# Patient Record
Sex: Female | Born: 1990 | Race: Black or African American | Hispanic: No | Marital: Single | Smoking: Never smoker
Health system: Southern US, Community
[De-identification: ages and names within clinical notes are randomized; demographics above are authoritative.]

## PROBLEM LIST (undated history)

## (undated) ENCOUNTER — Emergency Department (HOSPITAL_BASED_OUTPATIENT_CLINIC_OR_DEPARTMENT_OTHER): Payer: Self-pay | Source: Home / Self Care

## (undated) DIAGNOSIS — J45909 Unspecified asthma, uncomplicated: Secondary | ICD-10-CM

---

## 2016-06-18 ENCOUNTER — Emergency Department (HOSPITAL_BASED_OUTPATIENT_CLINIC_OR_DEPARTMENT_OTHER): Payer: Self-pay

## 2016-06-18 ENCOUNTER — Emergency Department (HOSPITAL_BASED_OUTPATIENT_CLINIC_OR_DEPARTMENT_OTHER)
Admission: EM | Admit: 2016-06-18 | Discharge: 2016-06-18 | Disposition: A | Discharge status: Home / Self Care | Payer: Self-pay | Attending: Emergency Medicine | Admitting: Emergency Medicine

## 2016-06-18 ENCOUNTER — Encounter (HOSPITAL_BASED_OUTPATIENT_CLINIC_OR_DEPARTMENT_OTHER): Payer: Self-pay | Admitting: *Deleted

## 2016-06-18 DIAGNOSIS — R1084 Generalized abdominal pain: Secondary | ICD-10-CM

## 2016-06-18 DIAGNOSIS — Z79899 Other long term (current) drug therapy: Secondary | ICD-10-CM | POA: Insufficient documentation

## 2016-06-18 DIAGNOSIS — K2970 Gastritis, unspecified, without bleeding: Secondary | ICD-10-CM

## 2016-06-18 DIAGNOSIS — R110 Nausea: Secondary | ICD-10-CM

## 2016-06-18 DIAGNOSIS — J45909 Unspecified asthma, uncomplicated: Secondary | ICD-10-CM | POA: Insufficient documentation

## 2016-06-18 HISTORY — DX: Unspecified asthma, uncomplicated: J45.909

## 2016-06-18 LAB — CBC WITH DIFFERENTIAL/PLATELET
BASOS ABS: 0 10*3/uL (ref 0.0–0.1)
Basophils Relative: 0 %
EOS PCT: 1 %
Eosinophils Absolute: 0.1 10*3/uL (ref 0.0–0.7)
HCT: 36.3 % (ref 36.0–46.0)
Hemoglobin: 12.3 g/dL (ref 12.0–15.0)
LYMPHS ABS: 2.9 10*3/uL (ref 0.7–4.0)
Lymphocytes Relative: 26 %
MCH: 31.3 pg (ref 26.0–34.0)
MCHC: 33.9 g/dL (ref 30.0–36.0)
MCV: 92.4 fL (ref 78.0–100.0)
Monocytes Absolute: 0.9 10*3/uL (ref 0.1–1.0)
Monocytes Relative: 8 %
Neutro Abs: 7.3 10*3/uL (ref 1.7–7.7)
Neutrophils Relative %: 65 %
PLATELETS: 228 10*3/uL (ref 150–400)
RBC: 3.93 MIL/uL (ref 3.87–5.11)
RDW: 13.8 % (ref 11.5–15.5)
WBC: 11.2 10*3/uL — AB (ref 4.0–10.5)

## 2016-06-18 LAB — COMPREHENSIVE METABOLIC PANEL
ALT: 13 U/L — AB (ref 14–54)
AST: 16 U/L (ref 15–41)
Albumin: 3.7 g/dL (ref 3.5–5.0)
Alkaline Phosphatase: 54 U/L (ref 38–126)
Anion gap: 5 (ref 5–15)
BUN: 7 mg/dL (ref 6–20)
CHLORIDE: 107 mmol/L (ref 101–111)
CO2: 26 mmol/L (ref 22–32)
CREATININE: 0.77 mg/dL (ref 0.44–1.00)
Calcium: 9 mg/dL (ref 8.9–10.3)
GFR calc non Af Amer: >60 mL/min (ref >60)
Glucose, Bld: 95 mg/dL (ref 65–99)
Potassium: 4.1 mmol/L (ref 3.5–5.1)
SODIUM: 138 mmol/L (ref 135–145)
Total Bilirubin: 0.3 mg/dL (ref 0.3–1.2)
Total Protein: 7.4 g/dL (ref 6.5–8.1)

## 2016-06-18 LAB — URINALYSIS, ROUTINE W REFLEX MICROSCOPIC
Bilirubin Urine: NEGATIVE
Glucose, UA: NEGATIVE mg/dL
Hgb urine dipstick: NEGATIVE
KETONES UR: NEGATIVE mg/dL
Leukocytes, UA: NEGATIVE
NITRITE: NEGATIVE
PROTEIN: NEGATIVE mg/dL
Specific Gravity, Urine: 1.02 (ref 1.005–1.030)
pH: 6.5 (ref 5.0–8.0)

## 2016-06-18 LAB — LIPASE, BLOOD: Lipase: 19 U/L (ref 11–51)

## 2016-06-18 LAB — PREGNANCY, URINE: PREG TEST UR: NEGATIVE

## 2016-06-18 MED ORDER — ONDANSETRON HCL 4 MG/2ML IJ SOLN
4.0000 mg | Freq: Once | INTRAMUSCULAR | Status: AC
  Administered 2016-06-18: 4 mg via INTRAVENOUS
  Filled 2016-06-18: qty 2

## 2016-06-18 MED ORDER — ONDANSETRON 4 MG PO TBDP: 4.0000 mg | ORAL_TABLET | Freq: Three times a day (TID) | ORAL | 0 refills | Status: AC | PRN

## 2016-06-18 MED ORDER — SODIUM CHLORIDE 0.9 % IV BOLUS (SEPSIS)
1000.0000 mL | Freq: Once | INTRAVENOUS | Status: AC
  Administered 2016-06-18: 1000 mL via INTRAVENOUS

## 2016-06-18 MED ORDER — MORPHINE SULFATE (PF) 4 MG/ML IV SOLN
4.0000 mg | Freq: Once | INTRAVENOUS | Status: AC
  Administered 2016-06-18: 4 mg via INTRAVENOUS
  Filled 2016-06-18: qty 1

## 2016-06-18 MED ORDER — FAMOTIDINE IN NACL 20-0.9 MG/50ML-% IV SOLN
20.0000 mg | Freq: Once | INTRAVENOUS | Status: AC
  Administered 2016-06-18: 20 mg via INTRAVENOUS
  Filled 2016-06-18: qty 50

## 2016-06-18 MED ORDER — RANITIDINE HCL 150 MG PO TABS: 150.0000 mg | ORAL_TABLET | Freq: Two times a day (BID) | ORAL | 0 refills | Status: AC

## 2016-06-18 NOTE — Discharge Instructions (Signed)
Your abdominal pain is likely from gastritis or an ulcer. You will need to take zantac as directed, and avoid spicy/fatty/acidic foods, avoid soda/coffee/tea/alcohol. Avoid laying down flat within 30 minutes of eating. Avoid NSAIDs like ibuprofen/aleve/motrin/etc on an empty stomach. May consider using over the counter tums/maalox as needed for additional relief. Use zofran as directed as needed for nausea. Use tylenol as needed for pain. Follow up with your regular doctor in one week for ongoing evaluation of your abdominal pain. Return to the ER for changes or worsening symptoms.  Abdominal (belly) pain can be caused by many things. Your caregiver performed an examination and possibly ordered blood/urine tests and imaging (CT scan, x-rays, ultrasound). Many cases can be observed and treated at home after initial evaluation in the emergency department. Even though you are being discharged home, abdominal pain can be unpredictable. Therefore, you need a repeated exam if your pain does not resolve, returns, or worsens. Most patients with abdominal pain don't have to be admitted to the hospital or have surgery, but serious problems like appendicitis and gallbladder attacks can start out as nonspecific pain. Many abdominal conditions cannot be diagnosed in one visit, so follow-up evaluations are very important. SEEK IMMEDIATE MEDICAL ATTENTION IF YOU DEVELOP ANY OF THE FOLLOWING SYMPTOMS: The pain does not go away or becomes severe.  A temperature above 101 develops.  Repeated vomiting occurs (multiple episodes).  The pain becomes localized to portions of the abdomen. The right side could possibly be appendicitis. In an adult, the left lower portion of the abdomen could be colitis or diverticulitis.  Blood is being passed in stools or vomit (bright red or black tarry stools).  Return also if you develop chest pain, difficulty breathing, dizziness or fainting, or become confused, poorly responsive, or  inconsolable (young children). The constipation stays for more than 4 days.  There is belly (abdominal) or rectal pain.  You do not seem to be getting better.

## 2016-06-18 NOTE — ED Notes (Signed)
Patient transported to Ultrasound 

## 2016-06-18 NOTE — ED Provider Notes (Signed)
[facility]   CSN: [id number] Arrival date & time: 06/18/16  [id number]     History   Chief Complaint Chief Complaint  Patient presents with  . Abdominal Pain    HPI Robin Gentry is a 26 y.o. female with a PMHx of asthma, and no significant PSHx, who presents to the ED with complaints of generalized abdominal pain 3 days. Patient describes the pain as 8/10 constant cramping and burning nonradiating generalized abdominal pain mostly localized around the periumbilical and upper abdominal area, worse with movement, laughing, and eating, unrelieved with Mira lax, Gas-X, and Pepto-Bismol, and mildly improved with Tylenol. Associated symptoms include nausea and decreased appetite. Robin Gentry admits to taking NSAIDs on a fairly regular basis for her menstrual cramps. Robin Gentry has an irregular menstrual cycle, LMP 05/24/16. Robin Gentry is sexually active with one female partner, unprotected. Robin Gentry denies any recent foreign travel, sick contacts, suspicious food intake, frequent alcohol use, or prior abdominal surgeries. However Robin Gentry does admit that Robin Gentry had 1 mixed EtOH beverage prior to onset of her symptoms, but states Robin Gentry drinks very infrequently and that was her only recent EtOH intake. Drinks coffee every morning.  Robin Gentry denies fevers, chills, CP, SOB, V/D/C, obstipation, melena, hematochezia, flank pain, hematuria, dysuria, vaginal bleeding/discharge, myalgias, arthralgias, numbness, tingling, focal weakness, or any other complaints at this time.    The history is provided by the patient and medical records. No language interpreter was used.  Abdominal Pain   This is a new problem. The current episode started more than 2 days ago. The problem occurs constantly. The problem has not changed since onset.The pain is associated with an unknown factor. The pain is located in the generalized abdominal region. The quality of the pain is cramping and burning. The pain is at a severity of 8/10. The pain is  moderate. Associated symptoms include nausea. Pertinent negatives include fever, diarrhea, flatus, hematochezia, melena, vomiting, constipation, dysuria, hematuria, arthralgias and myalgias. The symptoms are aggravated by eating and activity. The symptoms are relieved by acetaminophen.    Past Medical History:  Diagnosis Date  . Asthma     There are no active problems to display for this patient.   History reviewed. No pertinent surgical history.  OB History    No data available       Home Medications    Prior to Admission medications   Medication Sig Start Date End Date Taking? Authorizing Provider  albuterol (PROVENTIL HFA;VENTOLIN HFA) 108 (90 Base) MCG/ACT inhaler Inhale 1-2 puffs into the lungs every 6 (six) hours as needed for wheezing or shortness of breath.   Yes Historical Provider, MD    Family History No family history on file.  Social History Social History  Substance Use Topics  . Smoking status: Never Smoker  . Smokeless tobacco: Never Used  . Alcohol use Yes     Comment: 1-2x/wk     Allergies   Patient has no known allergies.   Review of Systems Review of Systems  Constitutional: Positive for appetite change. Negative for chills and fever.  Respiratory: Negative for shortness of breath.   Cardiovascular: Negative for chest pain.  Gastrointestinal: Positive for abdominal pain and nausea. Negative for blood in stool, constipation, diarrhea, flatus, hematochezia, melena and vomiting.  Genitourinary: Negative for dysuria, flank pain, hematuria, vaginal bleeding and vaginal discharge.  Musculoskeletal: Negative for arthralgias and myalgias.  Skin: Negative for color change.  Allergic/Immunologic: Negative for immunocompromised state.  Neurological: Negative for weakness and numbness.  Psychiatric/Behavioral:  Negative for confusion.   10 Systems reviewed and are negative for acute change except as noted in the HPI.   Physical Exam Updated Vital  Signs BP 119/76 (BP Location: Right Arm)   Pulse (!) 59   Temp 98.4 F (36.9 C) (Oral)   Resp 16   Ht 5\' 7"  (1.702 m)   Wt 90.7 kg   LMP 05/24/2016   SpO2 100%   BMI 31.32 kg/m   Physical Exam  Constitutional: Robin Gentry is oriented to person, place, and time. Vital signs are normal. Robin Gentry appears well-developed and well-nourished.  Non-toxic appearance. No distress.  Afebrile, nontoxic, NAD  HENT:  Head: Normocephalic and atraumatic.  Mouth/Throat: Oropharynx is clear and moist and mucous membranes are normal.  Eyes: Conjunctivae and EOM are normal. Right eye exhibits no discharge. Left eye exhibits no discharge.  Neck: Normal range of motion. Neck supple.  Cardiovascular: Normal rate, regular rhythm, normal heart sounds and intact distal pulses.  Exam reveals no gallop and no friction rub.   No murmur heard. Pulmonary/Chest: Effort normal and breath sounds normal. No respiratory distress. Robin Gentry has no decreased breath sounds. Robin Gentry has no wheezes. Robin Gentry has no rhonchi. Robin Gentry has no rales.  Abdominal: Soft. Normal appearance and bowel sounds are normal. Robin Gentry exhibits no distension. There is tenderness in the right upper quadrant, epigastric area and left upper quadrant. There is positive Murphy's sign. There is no rigidity, no rebound, no guarding, no CVA tenderness and no tenderness at McBurney's point.  Soft, nondistended, +BS throughout, with mild generalized TTP throughout but mostly focalized in the upper quadrants bilaterally and particularly in the RUQ, no r/g/r, +murphy's, neg mcburney's, no CVA TTP   Musculoskeletal: Normal range of motion.  Neurological: Robin Gentry is alert and oriented to person, place, and time. Robin Gentry has normal strength. No sensory deficit.  Skin: Skin is warm, dry and intact. No rash noted.  Psychiatric: Robin Gentry has a normal mood and affect.  Nursing note and vitals reviewed.    ED Treatments / Results  Labs (all labs ordered are listed, but only abnormal results are  displayed) Labs Reviewed  CBC WITH DIFFERENTIAL/PLATELET - Abnormal; Notable for the following:       Result Value   WBC 11.2 (*)    All other components within normal limits  COMPREHENSIVE METABOLIC PANEL - Abnormal; Notable for the following:    ALT 13 (*)    All other components within normal limits  URINALYSIS, ROUTINE W REFLEX MICROSCOPIC  PREGNANCY, URINE  LIPASE, BLOOD    EKG  EKG Interpretation None       Radiology US Abdomen Complete  Result Date: 06/18/2016 CLINICAL DATA:  Diffuse abdominal pain for 4 days, waxing and waning. Nausea. Bloating. EXAM: ABDOMEN ULTRASOUND COMPLETE COMPARISON:  None. FINDINGS: Gallbladder: No gallstones or wall thickening visualized. No sonographic Murphy sign noted by sonographer. Common bile duct: Diameter: 4 mm Liver: No focal lesion identified. Within normal limits in parenchymal echogenicity. IVC: No abnormality visualized. Pancreas:  Limited visualized portion unremarkable. Spleen: Size and appearance within normal limits. Right Kidney: Length: 11.4 cm. Echogenicity within normal limits. No mass or hydronephrosis visualized. Left Kidney: Length: 10.7 cm. Echogenicity within normal limits. No mass or hydronephrosis visualized. Abdominal aorta: No aneurysm visualized. Other findings: None. IMPRESSION: Normal abdominal sonogram, with no cholelithiasis. Electronically Signed   By: Delbert Phenix M.D.   On: 06/18/2016 10:46    Procedures Procedures (including critical care time)  Medications Ordered in ED Medications  sodium chloride 0.9 %  bolus 1,000 mL (1,000 mLs Intravenous New Bag/Given 06/18/16 0948)  ondansetron (ZOFRAN) injection 4 mg (4 mg Intravenous Given 06/18/16 0948)  famotidine (PEPCID) IVPB 20 mg premix (0 mg Intravenous Stopped 06/18/16 1035)  morphine 4 MG/ML injection 4 mg (4 mg Intravenous Given 06/18/16 0952)     Initial Impression / Assessment and Plan / ED Course  I have reviewed the triage vital signs and the nursing  notes.  Pertinent labs & imaging results that were available during my care of the patient were reviewed by me and considered in my medical decision making (see chart for details).     26 y.o. female here with generalized abd pain and nausea/loss of appetite x3 days. On exam, mild generalized TTP throughout but mostly concentrated in the upper quadrants bilaterally, especially in the RUQ, +murphy's, no CVA TTP, otherwise nonperitoneal. No significant lower abd TTP. No vaginal complaints, doubt need for pelvic exam at this time. Will obtain labs and abd u/s, give pain meds, pepcid, zofran, and fluids, then reassess shortly.   12:07 PM Upreg neg. U/A unremarkable. CBC w/diff with mildly elevated WBC 11.2 however differential unremarkable. CMP WNL. Lipase WNL. U/S unremarkable, no evidence of cholelithiasis. Pt feeling better after meds. Symptoms likely related to gastritis/PUD, will start on zantac and give zofran, diet/lifestyle modifications advised, tylenol PRN pain, advised limiting motrin use, and tums/maalox PRN for additional relief. F/up with PCP in 1wk for recheck. I explained the diagnosis and have given explicit precautions to return to the ER including for any other new or worsening symptoms. The patient understands and accepts the medical plan as it's been dictated and I have answered their questions. Discharge instructions concerning home care and prescriptions have been given. The patient is STABLE and is discharged to home in good condition.   Final Clinical Impressions(s) / ED Diagnoses   Final diagnoses:  Generalized abdominal pain  Nausea  Gastritis, presence of bleeding unspecified, unspecified chronicity, unspecified gastritis type    New Prescriptions New Prescriptions   ONDANSETRON (ZOFRAN ODT) 4 MG DISINTEGRATING TABLET    Take 1 tablet (4 mg total) by mouth every 8 (eight) hours as needed for nausea or vomiting.   RANITIDINE (ZANTAC) 150 MG TABLET    Take 1 tablet (150 mg  total) by mouth 2 (two) times daily.     [address], PA-C 06/18/16 1610    Pricilla Loveless, MD 06/21/16 [id number]

## 2016-06-18 NOTE — ED Triage Notes (Signed)
Pt reports generalized abd pain (concentrated around periumbilicus) since this past Thursday. Denies fever, v/d. Reports nausea. Denies genitourinary symptoms. Reports taking Tylenol with minimal relief.

## 2017-10-29 IMAGING — US US ABDOMEN COMPLETE
1 series · 14 of 25 positions shown · non-contrast
Comparison: None.

CLINICAL DATA: Diffuse abdominal pain for 4 days, waxing and
waning. Nausea. Bloating.

EXAM:
ABDOMEN ULTRASOUND COMPLETE

[Series 1: us abdomen complete · 0.20mm/px · 14 of 85 slices shown]
[im 1/85]
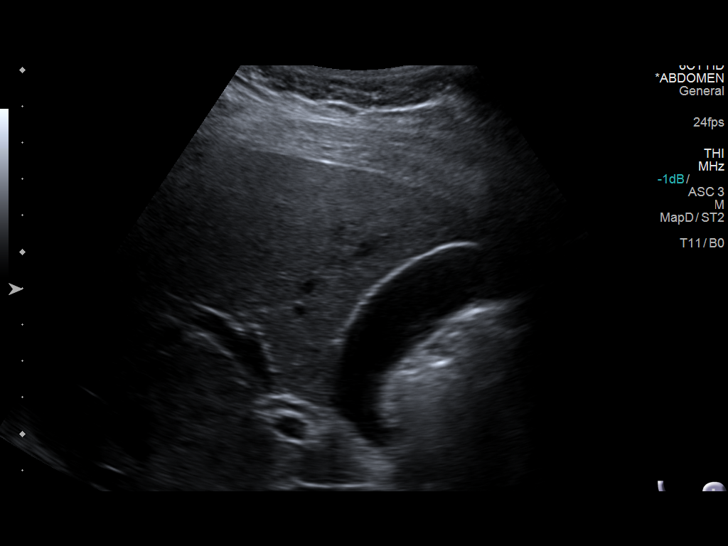
[im 8/85]
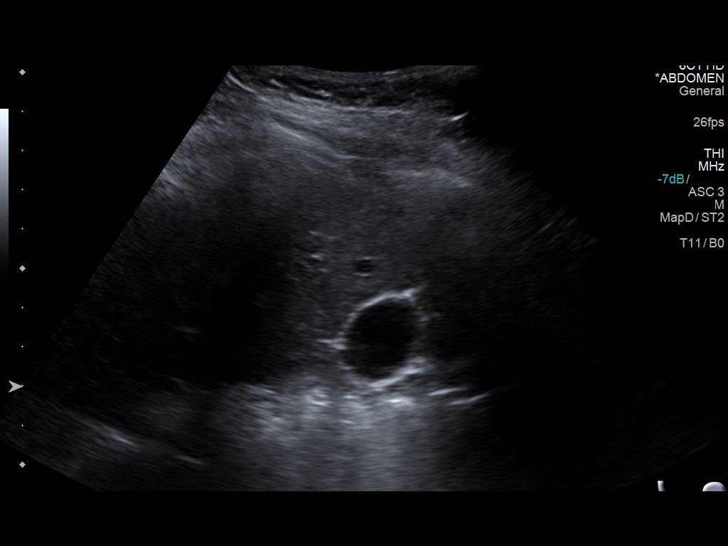
[im 15/85]
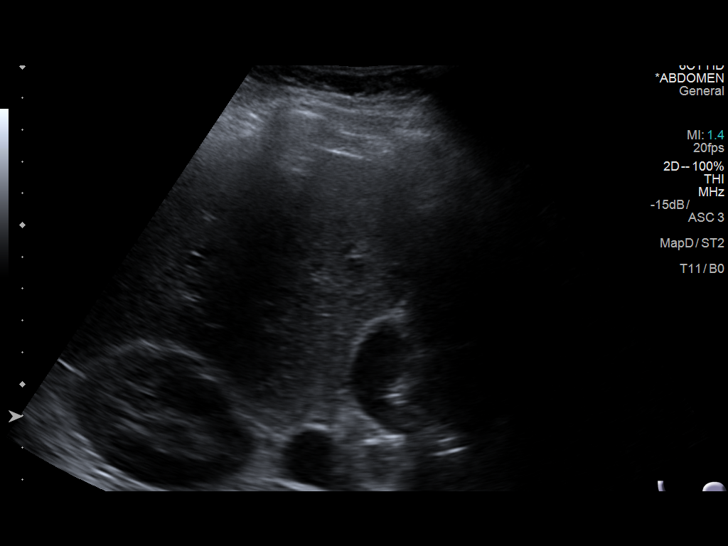
[im 22/85]
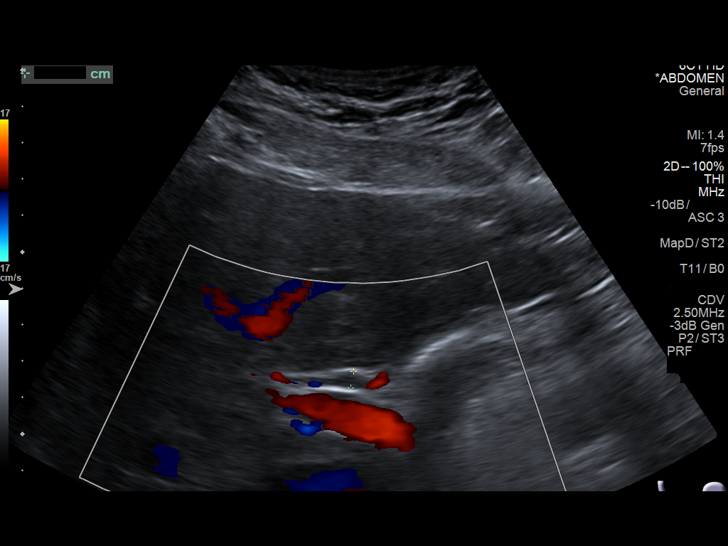
[im 29/85]
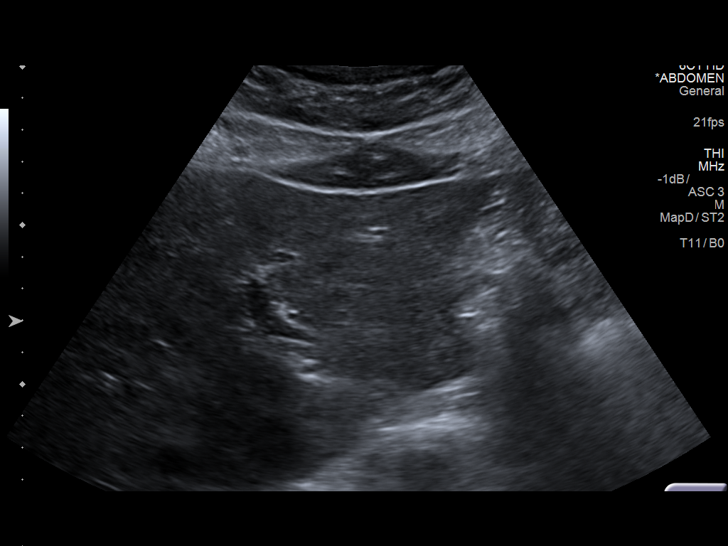
[im 32/85]
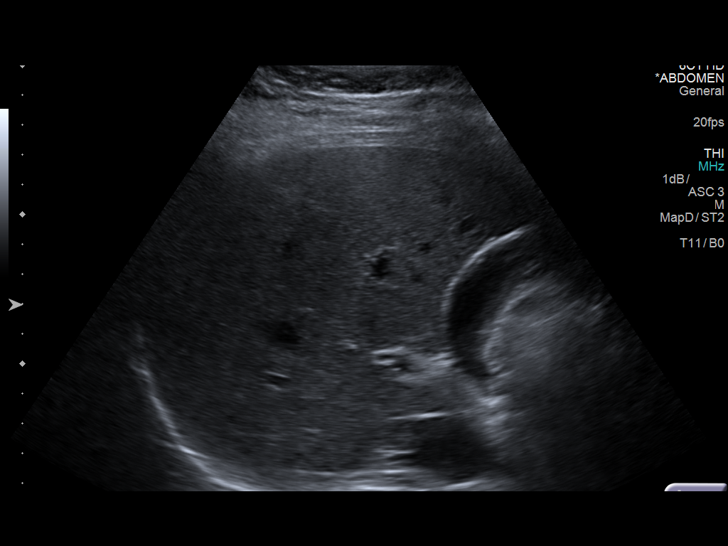
[im 39/85]
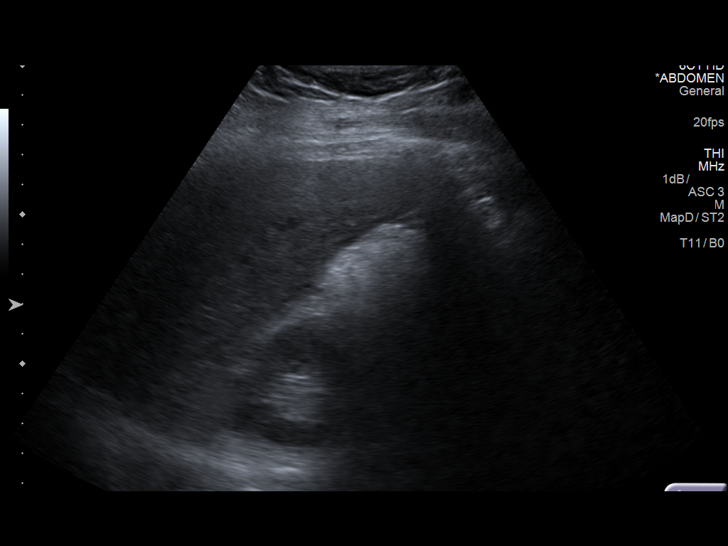
[im 46/85]
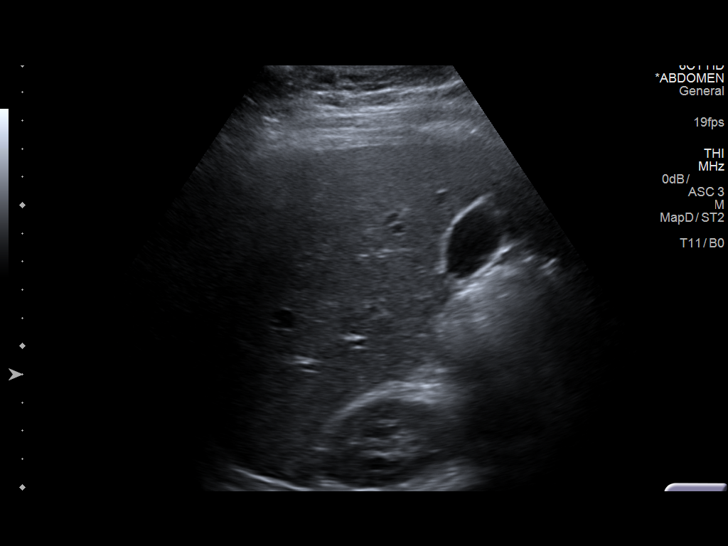
[im 53/85]
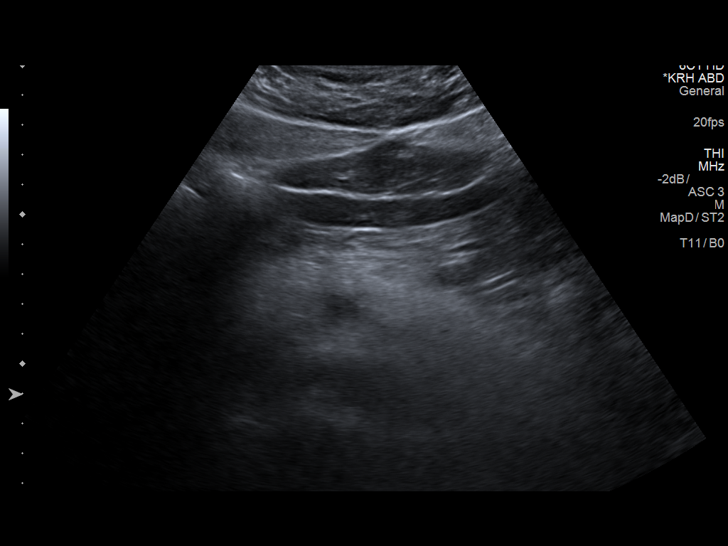
[im 57/85]
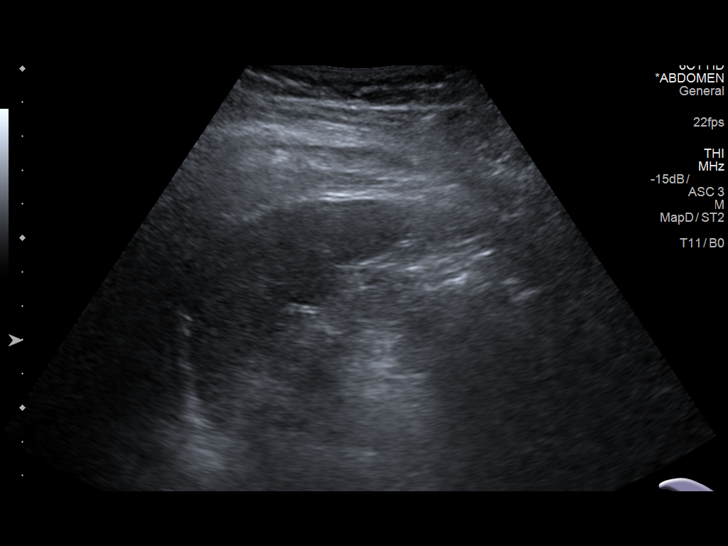
[im 64/85]
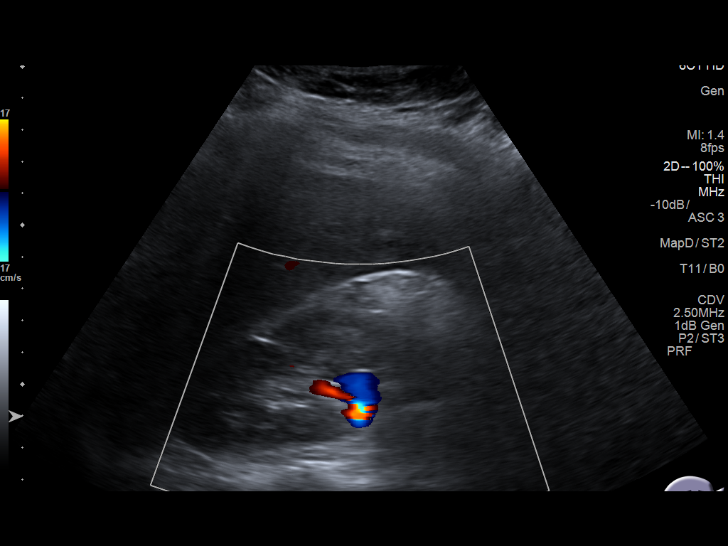
[im 71/85]
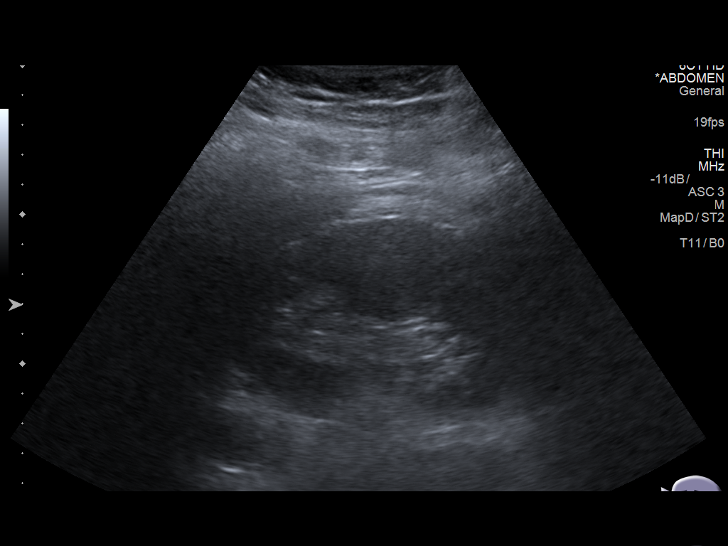
[im 78/85]
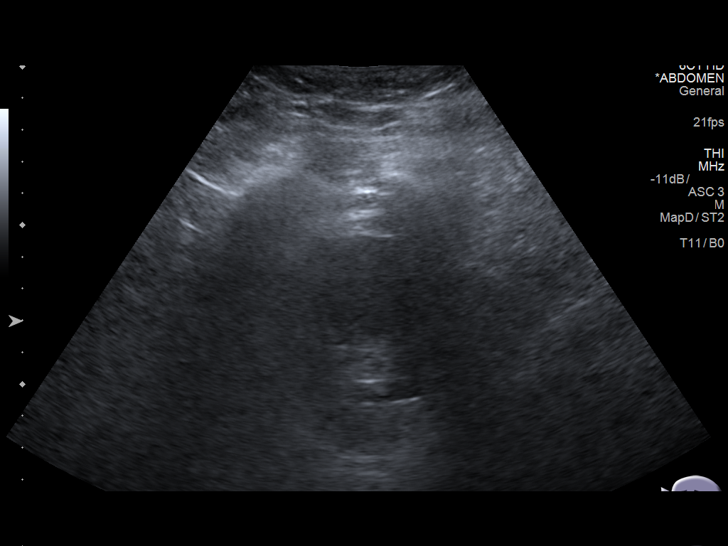
[im 85/85]
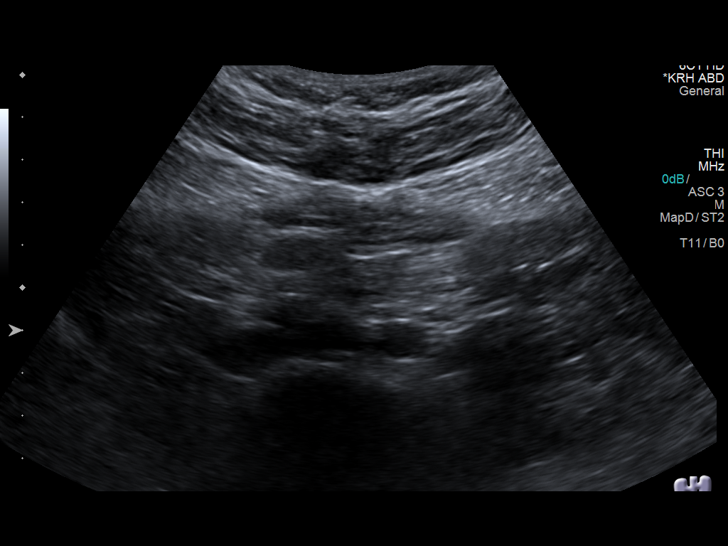

[14 of 25 positions shown; findings below may reference images not displayed]

FINDINGS: Gallbladder: No gallstones or wall thickening visualized. No
sonographic Murphy sign noted by sonographer.

Common bile duct: Diameter: 4 mm

Liver: No focal lesion identified. Within normal limits in
parenchymal echogenicity.

IVC: No abnormality visualized.

Pancreas:  Limited visualized portion unremarkable.

Spleen: Size and appearance within normal limits.

Right Kidney: Length: 11.4 cm. Echogenicity within normal limits. No
mass or hydronephrosis visualized.

Left Kidney: Length: 10.7 cm. Echogenicity within normal limits. No
mass or hydronephrosis visualized.

Abdominal aorta: No aneurysm visualized.

Other findings: None.
IMPRESSION: Normal abdominal sonogram, with no cholelithiasis.
